# Patient Record
Sex: Female | Born: 1965 | Race: White | Hispanic: No | Marital: Single | Smoking: Current every day smoker
Health system: Southern US, Community
[De-identification: ages and names within clinical notes are randomized; demographics above are authoritative.]

---

## 2009-01-12 ENCOUNTER — Emergency Department: Payer: Self-pay | Admitting: Emergency Medicine

## 2009-04-05 ENCOUNTER — Emergency Department: Payer: Self-pay | Admitting: Emergency Medicine

## 2009-12-27 ENCOUNTER — Emergency Department: Payer: Self-pay | Admitting: Emergency Medicine

## 2011-07-09 ENCOUNTER — Emergency Department: Payer: Self-pay | Admitting: *Deleted

## 2011-09-07 ENCOUNTER — Emergency Department: Payer: Self-pay | Admitting: Emergency Medicine

## 2011-09-10 ENCOUNTER — Emergency Department: Payer: Self-pay | Admitting: Emergency Medicine

## 2012-04-06 ENCOUNTER — Emergency Department: Payer: Self-pay | Admitting: Emergency Medicine

## 2012-04-06 LAB — URINALYSIS, COMPLETE
Bilirubin,UR: NEGATIVE
Blood: NEGATIVE
Glucose,UR: NEGATIVE mg/dL (ref 0–75)
Ketone: NEGATIVE
Leukocyte Esterase: NEGATIVE
Nitrite: NEGATIVE
Ph: 5 (ref 4.5–8.0)
RBC,UR: 1 /HPF (ref 0–5)
Squamous Epithelial: 6

## 2012-04-07 LAB — URINE CULTURE
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
## 2014-03-31 ENCOUNTER — Emergency Department: Payer: Self-pay | Admitting: Internal Medicine

## 2014-03-31 LAB — URINALYSIS, COMPLETE
BACTERIA: NONE SEEN
Bilirubin,UR: NEGATIVE
Glucose,UR: NEGATIVE mg/dL (ref 0–75)
Ketone: NEGATIVE
Nitrite: NEGATIVE
PROTEIN: NEGATIVE
Ph: 5 (ref 4.5–8.0)
SPECIFIC GRAVITY: 1.02 (ref 1.003–1.030)
Squamous Epithelial: 3
WBC UR: 2 /HPF (ref 0–5)

## 2014-03-31 LAB — PREGNANCY, URINE: Pregnancy Test, Urine: NEGATIVE m[IU]/mL

## 2014-04-12 ENCOUNTER — Emergency Department: Payer: Self-pay | Admitting: Emergency Medicine

## 2014-04-12 LAB — URINALYSIS, COMPLETE
BILIRUBIN, UR: NEGATIVE
Bacteria: NONE SEEN
Blood: NEGATIVE
Glucose,UR: NEGATIVE mg/dL (ref 0–75)
LEUKOCYTE ESTERASE: NEGATIVE
NITRITE: NEGATIVE
PROTEIN: NEGATIVE
Ph: 5 (ref 4.5–8.0)
SPECIFIC GRAVITY: 1.021 (ref 1.003–1.030)
WBC UR: 2 /HPF (ref 0–5)

## 2014-04-14 LAB — URINE CULTURE

## 2015-05-08 ENCOUNTER — Encounter: Payer: Self-pay | Admitting: Emergency Medicine

## 2015-05-08 ENCOUNTER — Emergency Department: Payer: Self-pay

## 2015-05-08 ENCOUNTER — Emergency Department
Admission: EM | Admit: 2015-05-08 | Discharge: 2015-05-08 | Disposition: A | Discharge status: Home / Self Care | Payer: Self-pay | Attending: Student | Admitting: Student

## 2015-05-08 DIAGNOSIS — S46912A Strain of unspecified muscle, fascia and tendon at shoulder and upper arm level, left arm, initial encounter: Secondary | ICD-10-CM | POA: Insufficient documentation

## 2015-05-08 DIAGNOSIS — Y998 Other external cause status: Secondary | ICD-10-CM | POA: Insufficient documentation

## 2015-05-08 DIAGNOSIS — Y92410 Unspecified street and highway as the place of occurrence of the external cause: Secondary | ICD-10-CM | POA: Insufficient documentation

## 2015-05-08 DIAGNOSIS — Y9389 Activity, other specified: Secondary | ICD-10-CM | POA: Insufficient documentation

## 2015-05-08 DIAGNOSIS — F17200 Nicotine dependence, unspecified, uncomplicated: Secondary | ICD-10-CM | POA: Insufficient documentation

## 2015-05-08 MED ORDER — NAPROXEN 500 MG PO TABS: 500.0000 mg | ORAL_TABLET | Freq: Two times a day (BID) | ORAL | Status: AC
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
## 2015-05-08 NOTE — Discharge Instructions (Signed)
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
